# Patient Record
Sex: Female | Born: 1955 | Race: White | Hispanic: No | Marital: Married | Smoking: Never smoker
Health system: Southern US, Community
[De-identification: ages and names within clinical notes are randomized; demographics above are authoritative.]

## PROBLEM LIST (undated history)

## (undated) DIAGNOSIS — I619 Nontraumatic intracerebral hemorrhage, unspecified: Secondary | ICD-10-CM

## (undated) HISTORY — PX: TOTAL HIP ARTHROPLASTY: SHX124

## (undated) HISTORY — PX: APPENDECTOMY: SHX54

## (undated) HISTORY — PX: RHINOPLASTY: SUR1284

## (undated) HISTORY — PX: ANKLE SURGERY: SHX546

---

## 2017-03-19 ENCOUNTER — Emergency Department (HOSPITAL_COMMUNITY): Payer: 59

## 2017-03-19 ENCOUNTER — Emergency Department (HOSPITAL_COMMUNITY)
Admission: EM | Admit: 2017-03-19 | Discharge: 2017-03-19 | Disposition: A | Discharge status: Home / Self Care | Payer: 59 | Attending: Emergency Medicine | Admitting: Emergency Medicine

## 2017-03-19 ENCOUNTER — Other Ambulatory Visit: Payer: Self-pay

## 2017-03-19 ENCOUNTER — Encounter (HOSPITAL_COMMUNITY): Payer: Self-pay

## 2017-03-19 DIAGNOSIS — Y9389 Activity, other specified: Secondary | ICD-10-CM | POA: Insufficient documentation

## 2017-03-19 DIAGNOSIS — Y92410 Unspecified street and highway as the place of occurrence of the external cause: Secondary | ICD-10-CM | POA: Diagnosis not present

## 2017-03-19 DIAGNOSIS — Z96649 Presence of unspecified artificial hip joint: Secondary | ICD-10-CM | POA: Diagnosis not present

## 2017-03-19 DIAGNOSIS — Y998 Other external cause status: Secondary | ICD-10-CM | POA: Diagnosis not present

## 2017-03-19 DIAGNOSIS — S022XXB Fracture of nasal bones, initial encounter for open fracture: Secondary | ICD-10-CM | POA: Diagnosis not present

## 2017-03-19 DIAGNOSIS — S20219A Contusion of unspecified front wall of thorax, initial encounter: Secondary | ICD-10-CM | POA: Diagnosis not present

## 2017-03-19 DIAGNOSIS — S63615A Unspecified sprain of left ring finger, initial encounter: Secondary | ICD-10-CM | POA: Insufficient documentation

## 2017-03-19 DIAGNOSIS — S60221A Contusion of right hand, initial encounter: Secondary | ICD-10-CM | POA: Insufficient documentation

## 2017-03-19 DIAGNOSIS — S0083XA Contusion of other part of head, initial encounter: Secondary | ICD-10-CM | POA: Insufficient documentation

## 2017-03-19 DIAGNOSIS — S0992XA Unspecified injury of nose, initial encounter: Secondary | ICD-10-CM | POA: Diagnosis present

## 2017-03-19 HISTORY — DX: Nontraumatic intracerebral hemorrhage, unspecified: I61.9

## 2017-03-19 LAB — URINALYSIS, ROUTINE W REFLEX MICROSCOPIC
Bilirubin Urine: NEGATIVE
Glucose, UA: NEGATIVE mg/dL
KETONES UR: NEGATIVE mg/dL
Nitrite: NEGATIVE
PROTEIN: NEGATIVE mg/dL
Specific Gravity, Urine: 1.008 (ref 1.005–1.030)
pH: 6 (ref 5.0–8.0)

## 2017-03-19 MED ORDER — ACETAMINOPHEN 500 MG PO TABS
1000.0000 mg | ORAL_TABLET | Freq: Once | ORAL | Status: AC
Start: 2017-03-19 — End: 2017-03-19
  Administered 2017-03-19: 1000 mg via ORAL
  Filled 2017-03-19: qty 2

## 2017-03-19 MED ORDER — AMOXICILLIN-POT CLAVULANATE 875-125 MG PO TABS: 1.0000 | ORAL_TABLET | Freq: Two times a day (BID) | ORAL | 0 refills | Status: AC

## 2017-03-19 MED ORDER — AMOXICILLIN-POT CLAVULANATE 875-125 MG PO TABS
1.0000 | ORAL_TABLET | Freq: Once | ORAL | Status: AC
  Administered 2017-03-19: 1 via ORAL
  Filled 2017-03-19: qty 1

## 2017-03-19 MED ORDER — ACETAMINOPHEN 500 MG PO TABS: 1000.0000 mg | ORAL_TABLET | Freq: Four times a day (QID) | ORAL | 0 refills | Status: AC | PRN

## 2017-03-19 MED ORDER — MUPIROCIN 2 % EX OINT: TOPICAL_OINTMENT | CUTANEOUS | 0 refills | Status: AC

## 2017-03-19 MED ORDER — TRAMADOL HCL 50 MG PO TABS: ORAL_TABLET | ORAL | 0 refills | Status: AC

## 2017-03-19 NOTE — ED Notes (Signed)
Pt transported to CT ?

## 2017-03-19 NOTE — ED Notes (Signed)
Patient currently at CT scan .  
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
## 2017-03-19 NOTE — ED Notes (Addendum)
Returned from x-ray and CT scan , Dr. Judie PetitM. Pfeiffer explained tests results and plan of care to pt. and family , she also advised RN not to draw blood specimen/discontinue blood tests ordered .

## 2017-03-19 NOTE — ED Notes (Signed)
Pt transported to Cat scan

## 2017-03-19 NOTE — ED Notes (Signed)
Delay in lab draw,  Pt not in room at this time. 

## 2017-03-19 NOTE — ED Notes (Signed)
Nurse will start IV and collect labs once pt return back to room.
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
## 2017-03-21 LAB — URINE CULTURE

## 2017-07-13 ENCOUNTER — Other Ambulatory Visit (HOSPITAL_COMMUNITY): Payer: Self-pay | Admitting: Family Medicine

## 2017-07-13 DIAGNOSIS — S0990XS Unspecified injury of head, sequela: Principal | ICD-10-CM

## 2017-07-13 DIAGNOSIS — G44309 Post-traumatic headache, unspecified, not intractable: Secondary | ICD-10-CM

## 2017-07-18 ENCOUNTER — Ambulatory Visit (HOSPITAL_COMMUNITY)
Admission: RE | Admit: 2017-07-18 | Discharge: 2017-07-18 | Disposition: A | Discharge status: Home / Self Care | Payer: 59 | Source: Ambulatory Visit | Attending: Family Medicine | Admitting: Family Medicine

## 2017-07-18 DIAGNOSIS — S022XXA Fracture of nasal bones, initial encounter for closed fracture: Secondary | ICD-10-CM | POA: Diagnosis not present

## 2017-07-18 DIAGNOSIS — G44309 Post-traumatic headache, unspecified, not intractable: Secondary | ICD-10-CM | POA: Insufficient documentation

## 2017-07-18 DIAGNOSIS — I739 Peripheral vascular disease, unspecified: Secondary | ICD-10-CM | POA: Insufficient documentation

## 2017-07-18 DIAGNOSIS — G319 Degenerative disease of nervous system, unspecified: Secondary | ICD-10-CM | POA: Diagnosis not present

## 2017-07-18 DIAGNOSIS — X58XXXA Exposure to other specified factors, initial encounter: Secondary | ICD-10-CM | POA: Diagnosis not present

## 2017-07-18 DIAGNOSIS — S0990XS Unspecified injury of head, sequela: Secondary | ICD-10-CM
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
## 2023-03-20 ENCOUNTER — Inpatient Hospital Stay
Admission: RE | Admit: 2023-03-20 | Discharge: 2023-03-20 | Disposition: A | Discharge status: Home / Self Care | Payer: Self-pay | Source: Ambulatory Visit | Attending: Neurosurgery | Admitting: Neurosurgery

## 2023-03-20 ENCOUNTER — Other Ambulatory Visit: Payer: Self-pay | Admitting: Family Medicine

## 2023-03-20 DIAGNOSIS — Z049 Encounter for examination and observation for unspecified reason: Secondary | ICD-10-CM

## 2023-04-04 ENCOUNTER — Ambulatory Visit: Payer: Self-pay | Admitting: Neurosurgery
